# Patient Record
Sex: Female | Born: 1960 | Race: White | Hispanic: No | Marital: Single | Smoking: Former smoker
Health system: Southern US, Community
[De-identification: ages and names within clinical notes are randomized; demographics above are authoritative.]

## PROBLEM LIST (undated history)

## (undated) DIAGNOSIS — Z5189 Encounter for other specified aftercare: Secondary | ICD-10-CM

## (undated) DIAGNOSIS — E785 Hyperlipidemia, unspecified: Secondary | ICD-10-CM

## (undated) DIAGNOSIS — G4730 Sleep apnea, unspecified: Secondary | ICD-10-CM

## (undated) DIAGNOSIS — F419 Anxiety disorder, unspecified: Secondary | ICD-10-CM

## (undated) DIAGNOSIS — E079 Disorder of thyroid, unspecified: Secondary | ICD-10-CM

## (undated) HISTORY — PX: WISDOM TOOTH EXTRACTION: SHX21

## (undated) HISTORY — PX: POLYPECTOMY: SHX149

## (undated) HISTORY — DX: Encounter for other specified aftercare: Z51.89

## (undated) HISTORY — PX: COLONOSCOPY: SHX174

## (undated) HISTORY — DX: Hyperlipidemia, unspecified: E78.5

## (undated) HISTORY — DX: Disorder of thyroid, unspecified: E07.9

## (undated) HISTORY — DX: Anxiety disorder, unspecified: F41.9

## (undated) HISTORY — PX: FOOT SURGERY: SHX648

## (undated) HISTORY — PX: OTHER SURGICAL HISTORY: SHX169

## (undated) HISTORY — DX: Sleep apnea, unspecified: G47.30

---

## 1998-11-22 HISTORY — PX: PARTIAL HYSTERECTOMY: SHX80

## 2000-08-26 ENCOUNTER — Other Ambulatory Visit: Admission: RE | Admit: 2000-08-26 | Discharge: 2000-08-26 | Payer: Self-pay | Admitting: Gynecology

## 2003-03-18 ENCOUNTER — Other Ambulatory Visit: Admission: RE | Admit: 2003-03-18 | Discharge: 2003-03-18 | Payer: Self-pay | Admitting: Gynecology

## 2003-06-20 ENCOUNTER — Encounter: Admission: RE | Admit: 2003-06-20 | Discharge: 2003-06-20 | Payer: Self-pay | Admitting: Gynecology

## 2003-06-20 ENCOUNTER — Encounter: Payer: Self-pay | Admitting: Gynecology

## 2004-03-31 ENCOUNTER — Other Ambulatory Visit: Admission: RE | Admit: 2004-03-31 | Discharge: 2004-03-31 | Payer: Self-pay | Admitting: Gynecology

## 2005-07-23 ENCOUNTER — Other Ambulatory Visit: Admission: RE | Admit: 2005-07-23 | Discharge: 2005-07-23 | Payer: Self-pay | Admitting: Gynecology

## 2007-03-09 ENCOUNTER — Other Ambulatory Visit: Admission: RE | Admit: 2007-03-09 | Discharge: 2007-03-09 | Payer: Self-pay | Admitting: Gynecology

## 2011-04-20 ENCOUNTER — Other Ambulatory Visit: Payer: Self-pay | Admitting: Gynecology

## 2011-04-20 DIAGNOSIS — Z1231 Encounter for screening mammogram for malignant neoplasm of breast: Secondary | ICD-10-CM

## 2011-05-06 ENCOUNTER — Ambulatory Visit
Admission: RE | Admit: 2011-05-06 | Discharge: 2011-05-06 | Disposition: A | Discharge status: Home / Self Care | Payer: 59 | Source: Ambulatory Visit | Attending: Gynecology | Admitting: Gynecology

## 2011-05-06 DIAGNOSIS — Z1231 Encounter for screening mammogram for malignant neoplasm of breast: Secondary | ICD-10-CM

## 2011-07-17 DIAGNOSIS — E039 Hypothyroidism, unspecified: Secondary | ICD-10-CM | POA: Insufficient documentation

## 2013-09-24 ENCOUNTER — Other Ambulatory Visit: Payer: Self-pay | Admitting: Unknown Physician Specialty

## 2013-09-24 DIAGNOSIS — Z1231 Encounter for screening mammogram for malignant neoplasm of breast: Secondary | ICD-10-CM

## 2013-10-31 ENCOUNTER — Ambulatory Visit: Payer: 59

## 2014-03-29 ENCOUNTER — Other Ambulatory Visit: Payer: Self-pay | Admitting: Unknown Physician Specialty

## 2014-03-29 DIAGNOSIS — N644 Mastodynia: Secondary | ICD-10-CM

## 2014-04-10 ENCOUNTER — Encounter (INDEPENDENT_AMBULATORY_CARE_PROVIDER_SITE_OTHER): Payer: Self-pay

## 2014-04-10 ENCOUNTER — Other Ambulatory Visit: Payer: Self-pay | Admitting: Unknown Physician Specialty

## 2014-04-10 ENCOUNTER — Ambulatory Visit
Admission: RE | Admit: 2014-04-10 | Discharge: 2014-04-10 | Disposition: A | Discharge status: Home / Self Care | Payer: Private Health Insurance - Indemnity | Source: Ambulatory Visit | Attending: Unknown Physician Specialty | Admitting: Unknown Physician Specialty

## 2014-04-10 DIAGNOSIS — N644 Mastodynia: Secondary | ICD-10-CM

## 2015-04-04 ENCOUNTER — Encounter: Payer: Self-pay | Admitting: Unknown Physician Specialty

## 2015-04-15 ENCOUNTER — Encounter: Payer: Self-pay | Admitting: Internal Medicine

## 2015-05-12 ENCOUNTER — Ambulatory Visit (AMBULATORY_SURGERY_CENTER): Payer: Self-pay | Admitting: *Deleted

## 2015-05-12 VITALS — Ht 62.0 in | Wt 148.0 lb

## 2015-05-12 DIAGNOSIS — Z1211 Encounter for screening for malignant neoplasm of colon: Secondary | ICD-10-CM

## 2015-05-12 MED ORDER — NA SULFATE-K SULFATE-MG SULF 17.5-3.13-1.6 GM/177ML PO SOLN: ORAL | Status: DC

## 2015-05-12 NOTE — Progress Notes (Signed)
Patient denies any allergies to eggs or soy. Patient denies any problems with anesthesia/sedation. Patient denies any oxygen use at home and does not take any diet/weight loss medications. EMMI education assisgned to patient on colonoscopy, this was explained and instructions given to patient. 

## 2015-05-22 ENCOUNTER — Ambulatory Visit (AMBULATORY_SURGERY_CENTER): Payer: Managed Care, Other (non HMO) | Admitting: Internal Medicine

## 2015-05-22 ENCOUNTER — Encounter: Payer: Self-pay | Admitting: Internal Medicine

## 2015-05-22 VITALS — BP 121/91 | HR 68 | Temp 98.9°F | Resp 22 | Ht 62.0 in | Wt 148.0 lb

## 2015-05-22 DIAGNOSIS — Z1211 Encounter for screening for malignant neoplasm of colon: Secondary | ICD-10-CM

## 2015-05-22 DIAGNOSIS — D124 Benign neoplasm of descending colon: Secondary | ICD-10-CM

## 2015-05-22 DIAGNOSIS — D125 Benign neoplasm of sigmoid colon: Secondary | ICD-10-CM | POA: Diagnosis not present

## 2015-05-22 MED ORDER — SODIUM CHLORIDE 0.9 % IV SOLN: 500.0000 mL | INTRAVENOUS | Status: DC

## 2015-05-22 NOTE — Progress Notes (Signed)
A/ox3 pleased with MAC, report to Celia RN 

## 2015-05-22 NOTE — Progress Notes (Signed)
Called to room to assist during endoscopic procedure.  Patient ID and intended procedure confirmed with present staff. Received instructions for my participation in the procedure from the performing physician.  

## 2015-05-22 NOTE — Op Note (Signed)
[facility]  [address] [place], [id number]   COLONOSCOPY PROCEDURE REPORT  PATIENT: Angelica, Silva  MR#: [id number] BIRTHDATE: 1961/08/21 , 53  yrs. old GENDER: female ENDOSCOPIST: Jerene Bears, MD REFERRED BY: Finis Bud PROCEDURE DATE:  05/22/2015 PROCEDURE:   Colonoscopy, screening and Colonoscopy with snare polypectomy First Screening Colonoscopy - Avg.  risk and is 50 yrs.  old or older Yes.  Prior Negative Screening - Now for repeat screening. N/A  History of Adenoma - Now for follow-up colonoscopy & has been > or = to 3 yrs.  N/A  Polyps removed today? Yes ASA CLASS:   Class II INDICATIONS:Screening for colonic neoplasia and Colorectal Neoplasm Risk Assessment for this procedure is average risk. MEDICATIONS: Monitored anesthesia care and Propofol 240 mg IV  DESCRIPTION OF PROCEDURE:   After the risks benefits and alternatives of the procedure were thoroughly explained, informed consent was obtained.  The digital rectal exam revealed no rectal mass.   The LB [id number]  endoscope was introduced through the anus and advanced to the cecum, which was identified by both the appendix and ileocecal valve. No adverse events experienced. The quality of the prep was good.  (Suprep was used)  The instrument was then slowly withdrawn as the colon was fully examined. Estimated blood loss is zero unless otherwise noted in this procedure report.   COLON FINDINGS: Five sessile polyps ranging from 3 to 48mm in size were found in the descending colon and sigmoid colon. Polypectomies were performed with a cold snare.  The resection was complete, the polyp tissue was completely retrieved and sent to histology.   The examination was otherwise normal.  Retroflexed views revealed internal hemorrhoids. The time to cecum = 3.1 Withdrawal time = 14.2   The scope was withdrawn and the procedure completed. COMPLICATIONS: There were no immediate  complications.  ENDOSCOPIC IMPRESSION: 1.   Five sessile polyps ranging from 3 to 69mm in size were found in the descending colon and sigmoid colon; polypectomies were performed with a cold snare 2.   The examination was otherwise normal  RECOMMENDATIONS: 1.  Await pathology results 2.  Timing of repeat colonoscopy will be determined by pathology findings. 3.  You will receive a letter within 1-2 weeks with the results of your biopsy as well as final recommendations.  Please call my office if you have not received a letter after 3 weeks.  eSigned:  Jerene Bears, MD 05/22/2015 4:08 PM   cc:  the patient, Finis Bud

## 2015-05-22 NOTE — Patient Instructions (Signed)
Discharge instructions given. Handout on polyps. Resume previous medications. YOU HAD AN ENDOSCOPIC PROCEDURE TODAY AT THE [facility]:   Refer to the procedure report that was given to you for any specific questions about what was found during the examination.  If the procedure report does not answer your questions, please call your gastroenterologist to clarify.  If you requested that your care partner not be given the details of your procedure findings, then the procedure report has been included in a sealed envelope for you to review at your convenience later.  YOU SHOULD EXPECT: Some feelings of bloating in the abdomen. Passage of more gas than usual.  Walking can help get rid of the air that was put into your GI tract during the procedure and reduce the bloating. If you had a lower endoscopy (such as a colonoscopy or flexible sigmoidoscopy) you may notice spotting of blood in your stool or on the toilet paper. If you underwent a bowel prep for your procedure, you may not have a normal bowel movement for a few days.  Please Note:  You might notice some irritation and congestion in your nose or some drainage.  This is from the oxygen used during your procedure.  There is no need for concern and it should clear up in a day or so.  SYMPTOMS TO REPORT IMMEDIATELY:   Following lower endoscopy (colonoscopy or flexible sigmoidoscopy):  Excessive amounts of blood in the stool  Significant tenderness or worsening of abdominal pains  Swelling of the abdomen that is new, acute  Fever of 100F or higher   For urgent or emergent issues, a gastroenterologist can be reached at any hour by calling [phone].   DIET: Your first meal following the procedure should be a small meal and then it is ok to progress to your normal diet. Heavy or fried foods are harder to digest and may make you feel nauseous or bloated.  Likewise, meals heavy in dairy and vegetables can increase bloating.  Drink  plenty of fluids but you should avoid alcoholic beverages for 24 hours.  ACTIVITY:  You should plan to take it easy for the rest of today and you should NOT DRIVE or use heavy machinery until tomorrow (because of the sedation medicines used during the test).    FOLLOW UP: Our staff will call the number listed on your records the next business day following your procedure to check on you and address any questions or concerns that you may have regarding the information given to you following your procedure. If we do not reach you, we will leave a message.  However, if you are feeling well and you are not experiencing any problems, there is no need to return our call.  We will assume that you have returned to your regular daily activities without incident.  If any biopsies were taken you will be contacted by phone or by letter within the next 1-3 weeks.  Please call us at [phone] if you have not heard about the biopsies in 3 weeks.    SIGNATURES/CONFIDENTIALITY: You and/or your care partner have signed paperwork which will be entered into your electronic medical record.  These signatures attest to the fact that that the information above on your After Visit Summary has been reviewed and is understood.  Full responsibility of the confidentiality of this discharge information lies with you and/or your care-partner. 

## 2015-05-23 ENCOUNTER — Telehealth: Payer: Self-pay

## 2015-05-23 NOTE — Telephone Encounter (Signed)
  Follow up Call-  Call back number 05/22/2015  Post procedure Call Back phone  # [phone]  Permission to leave phone message Yes     Patient questions:  Do you have a fever, pain , or abdominal swelling? No. Pain Score  0 *  Have you tolerated food without any problems? Yes.    Have you been able to return to your normal activities? Yes.    Do you have any questions about your discharge instructions: Diet   No. Medications  No. Follow up visit  No.  Do you have questions or concerns about your Care? No.  Actions: * If pain score is 4 or above: No action needed, pain <4.  No problems per the pt. maw

## 2015-05-29 ENCOUNTER — Encounter: Payer: Self-pay | Admitting: Internal Medicine

## 2015-06-27 ENCOUNTER — Other Ambulatory Visit: Payer: Self-pay | Admitting: Unknown Physician Specialty

## 2015-06-27 DIAGNOSIS — Z1231 Encounter for screening mammogram for malignant neoplasm of breast: Secondary | ICD-10-CM

## 2015-07-03 ENCOUNTER — Ambulatory Visit: Payer: Managed Care, Other (non HMO)

## 2016-01-07 ENCOUNTER — Other Ambulatory Visit: Payer: Self-pay | Admitting: Unknown Physician Specialty

## 2016-01-07 DIAGNOSIS — R5381 Other malaise: Secondary | ICD-10-CM

## 2016-01-07 DIAGNOSIS — Z780 Asymptomatic menopausal state: Secondary | ICD-10-CM

## 2016-01-07 DIAGNOSIS — Z1231 Encounter for screening mammogram for malignant neoplasm of breast: Secondary | ICD-10-CM

## 2016-02-03 ENCOUNTER — Ambulatory Visit
Admission: RE | Admit: 2016-02-03 | Discharge: 2016-02-03 | Disposition: A | Discharge status: Home / Self Care | Payer: 59 | Source: Ambulatory Visit | Attending: Unknown Physician Specialty | Admitting: Unknown Physician Specialty

## 2016-02-03 DIAGNOSIS — Z1231 Encounter for screening mammogram for malignant neoplasm of breast: Secondary | ICD-10-CM

## 2016-02-03 DIAGNOSIS — Z780 Asymptomatic menopausal state: Secondary | ICD-10-CM

## 2017-01-25 ENCOUNTER — Other Ambulatory Visit: Payer: Self-pay | Admitting: Unknown Physician Specialty

## 2017-01-25 DIAGNOSIS — Z1231 Encounter for screening mammogram for malignant neoplasm of breast: Secondary | ICD-10-CM

## 2017-07-22 ENCOUNTER — Other Ambulatory Visit: Payer: Self-pay | Admitting: Unknown Physician Specialty

## 2017-07-26 ENCOUNTER — Other Ambulatory Visit: Payer: Self-pay | Admitting: Unknown Physician Specialty

## 2017-07-26 DIAGNOSIS — Z780 Asymptomatic menopausal state: Secondary | ICD-10-CM

## 2018-03-16 ENCOUNTER — Encounter: Payer: Self-pay | Admitting: *Deleted

## 2018-04-11 ENCOUNTER — Encounter: Payer: Self-pay | Admitting: Internal Medicine

## 2018-06-29 ENCOUNTER — Other Ambulatory Visit: Payer: Self-pay | Admitting: Unknown Physician Specialty

## 2018-06-29 DIAGNOSIS — Z780 Asymptomatic menopausal state: Secondary | ICD-10-CM

## 2019-07-03 ENCOUNTER — Other Ambulatory Visit: Payer: Self-pay | Admitting: Unknown Physician Specialty

## 2019-07-03 ENCOUNTER — Encounter: Payer: Self-pay | Admitting: Internal Medicine

## 2019-07-03 DIAGNOSIS — M810 Age-related osteoporosis without current pathological fracture: Secondary | ICD-10-CM

## 2019-07-03 DIAGNOSIS — Z1231 Encounter for screening mammogram for malignant neoplasm of breast: Secondary | ICD-10-CM

## 2019-07-26 ENCOUNTER — Ambulatory Visit (AMBULATORY_SURGERY_CENTER): Payer: Self-pay | Admitting: *Deleted

## 2019-07-26 ENCOUNTER — Other Ambulatory Visit: Payer: Self-pay

## 2019-07-26 VITALS — Temp 97.1°F | Ht 61.0 in | Wt 140.0 lb

## 2019-07-26 DIAGNOSIS — Z86010 Personal history of colonic polyps: Secondary | ICD-10-CM

## 2019-07-26 MED ORDER — SUPREP BOWEL PREP KIT 17.5-3.13-1.6 GM/177ML PO SOLN: 1.0000 | Freq: Once | ORAL | 0 refills | Status: AC

## 2019-07-26 NOTE — Progress Notes (Signed)
No egg or soy allergy known to patient  No issues with past sedation with any surgeries  or procedures, no intubation problems  No diet pills per patient No home 02 use per patient  No blood thinners per patient  Pt denies issues with constipation  No A fib or A flutter  EMMI video sent to pt's e mail   Suprep $15 coupon to pt

## 2019-07-27 ENCOUNTER — Encounter: Payer: Self-pay | Admitting: Internal Medicine

## 2019-08-09 ENCOUNTER — Encounter: Payer: 59 | Admitting: Internal Medicine

## 2019-08-17 ENCOUNTER — Encounter: Payer: Self-pay | Admitting: Internal Medicine

## 2019-09-06 ENCOUNTER — Ambulatory Visit
Admission: RE | Admit: 2019-09-06 | Discharge: 2019-09-06 | Disposition: A | Discharge status: Home / Self Care | Payer: BC Managed Care – PPO | Source: Ambulatory Visit | Attending: Unknown Physician Specialty | Admitting: Unknown Physician Specialty

## 2019-09-06 ENCOUNTER — Other Ambulatory Visit: Payer: Self-pay

## 2019-09-06 DIAGNOSIS — M810 Age-related osteoporosis without current pathological fracture: Secondary | ICD-10-CM

## 2019-09-06 DIAGNOSIS — Z1231 Encounter for screening mammogram for malignant neoplasm of breast: Secondary | ICD-10-CM

## 2020-06-28 IMAGING — MG DIGITAL SCREENING BILAT W/ TOMO W/ CAD
8 series · 8 of 24 positions shown · non-contrast
Comparison: Previous exam(s).

CLINICAL DATA: Screening.

EXAM:
DIGITAL SCREENING BILATERAL MAMMOGRAM WITH TOMO AND CAD

[L CC synth-2D]
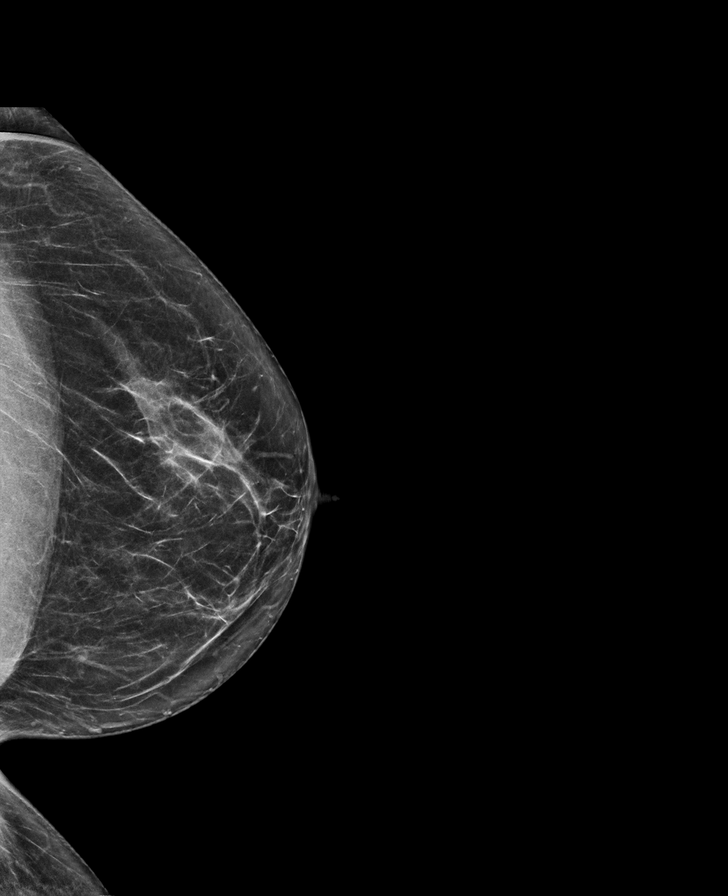

[L MLO synth-2D]
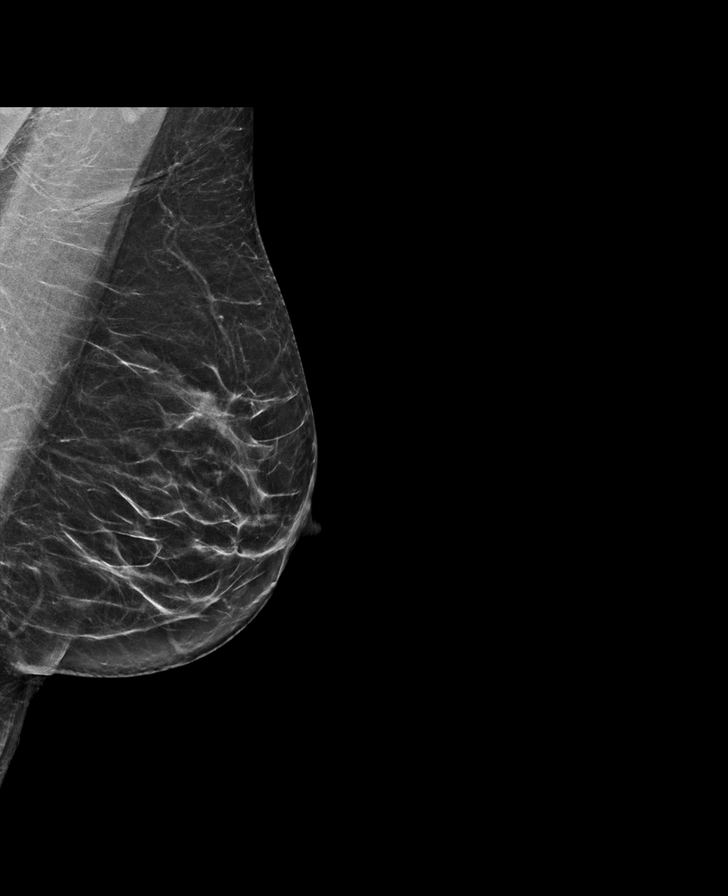

[R MLO synth-2D]
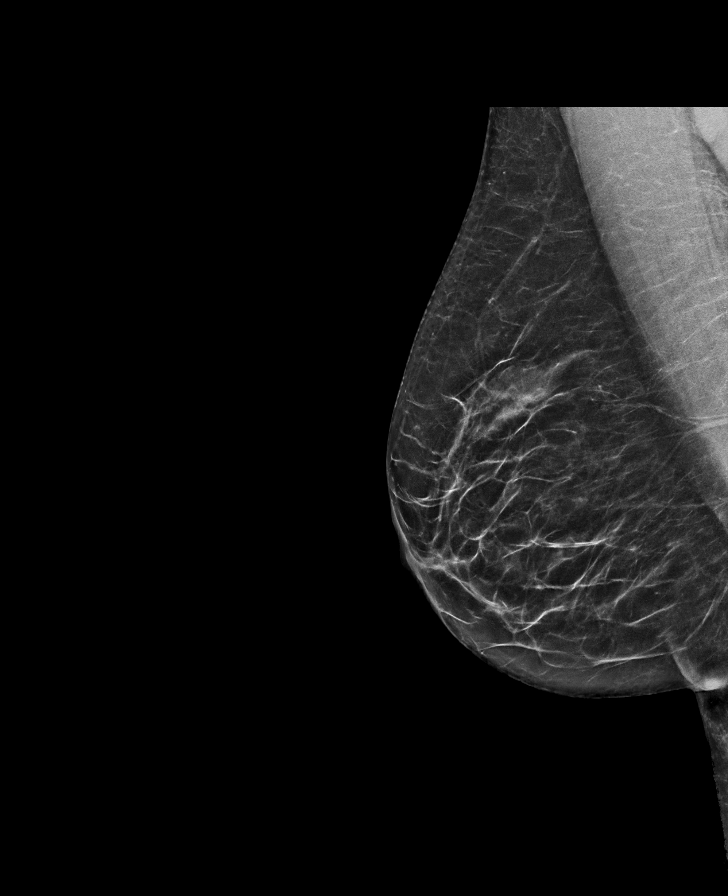

[R CC synth-2D]
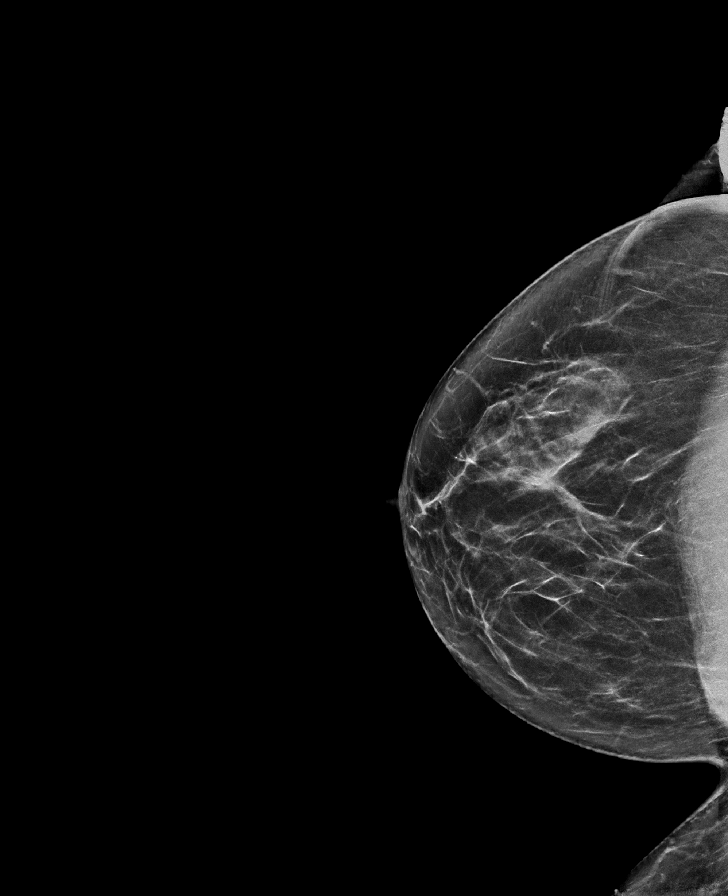

[R CC tomo · tomo slice 33/64.0]
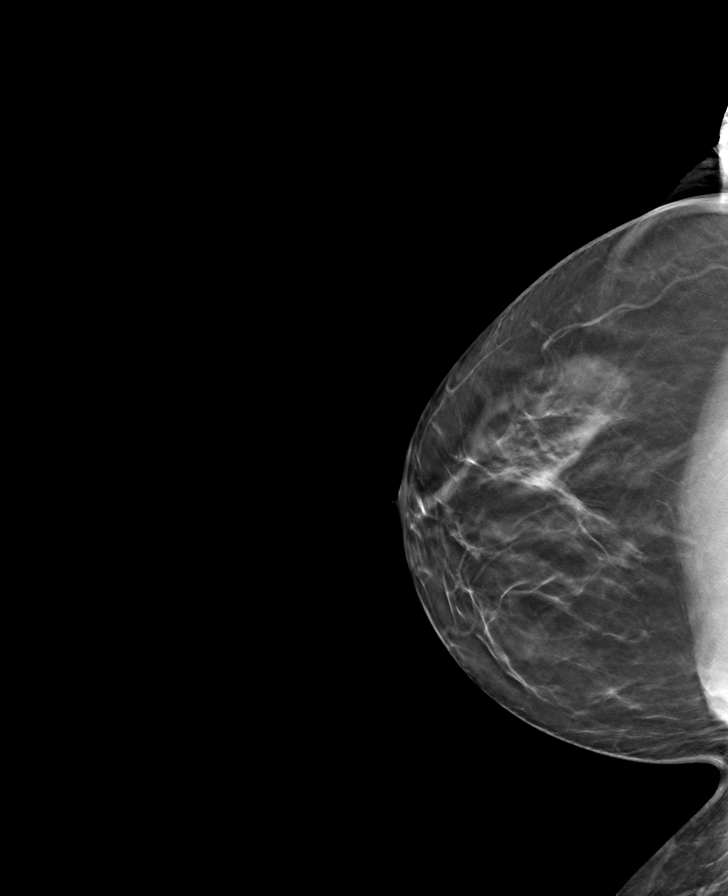

[R MLO tomo · tomo slice 32/63.0]
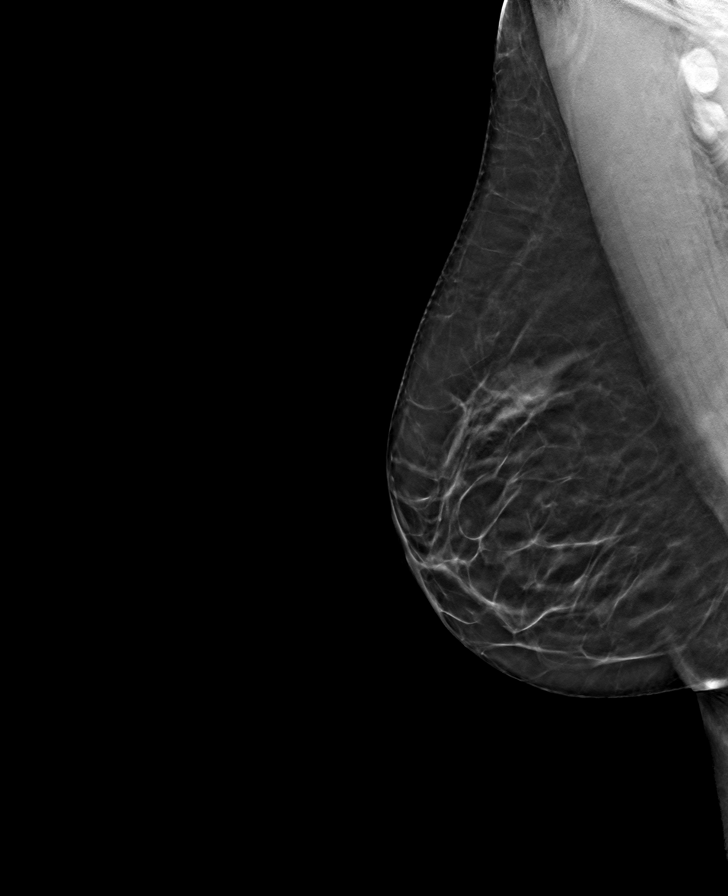

[L MLO tomo · tomo slice 31/60.0]
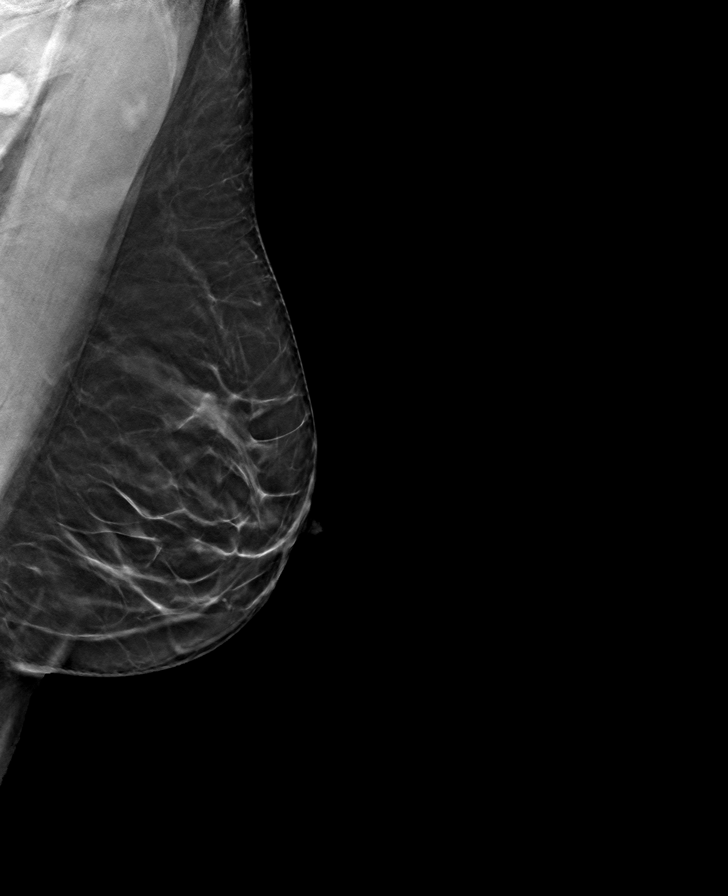

[L CC tomo · tomo slice 29/58.0]
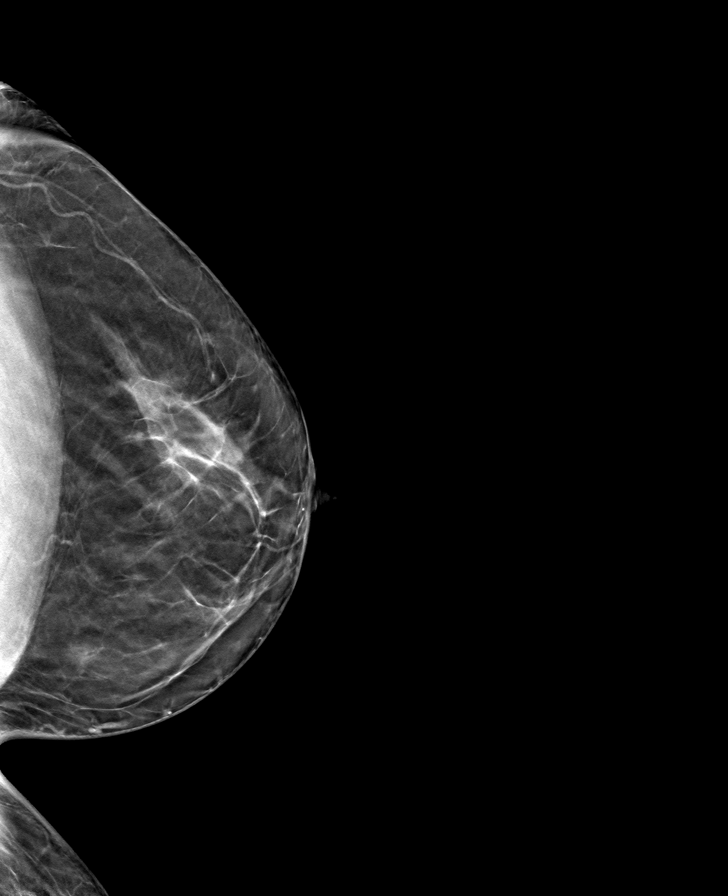

[8 of 24 positions shown; findings below may reference images not displayed]

ACR Breast Density Category b: There are scattered areas of
fibroglandular density.
FINDINGS: There are no findings suspicious for malignancy. Images were
processed with CAD.
IMPRESSION: No mammographic evidence of malignancy. A result letter of this
screening mammogram will be mailed directly to the patient.

RECOMMENDATION:
Screening mammogram in one year. (Code:CN-U-775)

BI-RADS CATEGORY  1: Negative.

## 2020-06-30 ENCOUNTER — Other Ambulatory Visit: Payer: Self-pay | Admitting: Unknown Physician Specialty

## 2020-06-30 DIAGNOSIS — Z1231 Encounter for screening mammogram for malignant neoplasm of breast: Secondary | ICD-10-CM

## 2024-10-11 ENCOUNTER — Ambulatory Visit: Admitting: Podiatry

## 2024-10-11 ENCOUNTER — Ambulatory Visit

## 2024-10-11 DIAGNOSIS — M2042 Other hammer toe(s) (acquired), left foot: Secondary | ICD-10-CM | POA: Diagnosis not present

## 2024-10-11 DIAGNOSIS — L84 Corns and callosities: Secondary | ICD-10-CM

## 2024-10-11 DIAGNOSIS — Z01818 Encounter for other preprocedural examination: Secondary | ICD-10-CM

## 2024-10-11 DIAGNOSIS — M7752 Other enthesopathy of left foot: Secondary | ICD-10-CM

## 2024-10-11 NOTE — Progress Notes (Signed)
 Subjective:  Patient ID: Angelica Silva, female    DOB: 06/01/1961,  MRN: [id number]  Chief Complaint  Patient presents with   Callouses    Place between toe     63 y.o. female presents with the above complaint.  Patient presents with complaint of left fifth metatarsal phalangeal joint pain.  Patient is experiencing heloma molle in between the fifth digit.  Is causing her a lot of discomfort.  Wanted to get it evaluated pain scale 7 out of 10 dull aching nature she has tried shoe gear modification padding offloading none of which has helped she wishes to undergo surgical intervention at this time.    Review of Systems: Negative except as noted in the HPI. Denies N/V/F/Ch.  Past Medical History:  Diagnosis Date   Anxiety    Blood transfusion without reported diagnosis    Hyperlipidemia    under control   Sleep apnea    no cpap    Thyroid disease     Current Outpatient Medications:    levothyroxine (SYNTHROID, LEVOTHROID) 88 MCG tablet, Take 88 mcg by mouth daily., Disp: , Rfl:    lisdexamfetamine (VYVANSE) 60 MG capsule, Take 60 mg by mouth every morning., Disp: , Rfl:    Multiple Vitamin (MULTIVITAMIN) tablet, Take 1 tablet by mouth daily., Disp: , Rfl:    OVER THE COUNTER MEDICATION, Bone strength otc daily, Disp: , Rfl:    OVER THE COUNTER MEDICATION, Pt states uses other supplements OTC , unsure names, Disp: , Rfl:    SYNTHROID 112 MCG tablet, , Disp: , Rfl:   Social History   Tobacco Use  Smoking Status Former  Smokeless Tobacco Never  Tobacco Comments   smoked 25 yrs     No Known Allergies Objective:  There were no vitals filed for this visit. There is no height or weight on file to calculate BMI. Constitutional Well developed. Well nourished.  Vascular Dorsalis pedis pulses palpable bilaterally. Posterior tibial pulses palpable bilaterally. Capillary refill normal to all digits.  No cyanosis or clubbing noted. Pedal hair growth normal.   Neurologic Normal speech. Oriented to person, place, and time. Epicritic sensation to light touch grossly present bilaterally.  Dermatologic Nails well groomed and normal in appearance. No open wounds. No skin lesions.  Orthopedic: Left 4th and 5th digit heloma molle in the interdigital space with fifth digit hammertoe contracture semiflexible in nature.  Pain on palpation left fifth digit PIPJ joint.  Capsulitis clinically appreciated.   Radiographs: 3 views of skeletally mature left foot: Hammertoe contracture of left 4th and 5th digit notedMild tailor's bunion noted.  No other abnormalities identified.  Mild midfoot arthritis noted Assessment:   1. Capsulitis of toe, left   2. Hammertoe of left foot   3. Heloma molle   4. Encounter for preoperative examination for general surgical procedure    Plan:  Patient was evaluated and treated and all questions answered.  Left 4th and 5th digit heloma molle with underlying fifth digit hammertoe contracture and fifth PIPJ joint capsulitis - All questions and concerns were discussed with the patient extensive detail given the amount of pain that she is experiencing she would benefit from surgical intervention she has failed all conservative care including shoe gear modification padding protecting offloading she wishes to undergo surgical intervention I discussed my preoperative entire postoperative plan with the patient she states understanding like to proceed with surgery.  She would benefit from syndactylization/webbing procedure of 4th and 5th digit to remove the heloma  molle and PIPJ arthroplasty.  I will schedule for surgery she states understanding -Informed surgical risk consent was reviewed and read aloud to the patient.  I reviewed the films.  I have discussed my findings with the patient in great detail.  I have discussed all risks including but not limited to infection, stiffness, scarring, limp, disability, deformity, damage to blood  vessels and nerves, numbness, poor healing, need for braces, arthritis, chronic pain, amputation, death.  All benefits and realistic expectations discussed in great detail.  I have made no promises as to the outcome.  I have provided realistic expectations.  I have offered the patient a 2nd opinion, which they have declined and assured me they preferred to proceed despite the risks  Left fifth digit PIPJ capsulitis - Given that she still has pain until surgery she will benefit from a steroid injection to help decrease acute inflammatory component surrounding the PIPJ joint.  She agrees with plan elected proceed with steroid injection -A steroid injection was performed at left fifth PIPJ joint using 1% plain Lidocaine and 10 mg of Kenalog. This was well tolerated.   No follow-ups on file.   Left 4th and 5th heloma molle with left fifth digit arthroplasty syndactylization surgical  Left fifth PIPJ capsultisi inejction

## 2024-11-26 ENCOUNTER — Telehealth: Payer: Self-pay | Admitting: Podiatry

## 2024-11-26 NOTE — Telephone Encounter (Signed)
 Called to schedule patient for surgery and she is wanting to postpone at this time due to change in insurance. Advised patient to contact office back when she was ready to discuss scheduling.
